# Patient Record
Sex: Male | Born: 1985 | Race: White | Hispanic: No | Marital: Married | Smoking: Current every day smoker
Health system: Southern US, Community
[De-identification: ages and names within clinical notes are randomized; demographics above are authoritative.]

## PROBLEM LIST (undated history)

## (undated) DIAGNOSIS — M1990 Unspecified osteoarthritis, unspecified site: Secondary | ICD-10-CM

## (undated) HISTORY — PX: NO PAST SURGERIES: SHX2092

## (undated) HISTORY — DX: Unspecified osteoarthritis, unspecified site: M19.90

---

## 2005-08-10 ENCOUNTER — Emergency Department: Payer: Self-pay | Admitting: Emergency Medicine

## 2008-10-25 ENCOUNTER — Emergency Department: Payer: Self-pay | Admitting: Emergency Medicine

## 2009-02-25 ENCOUNTER — Emergency Department: Payer: Self-pay | Admitting: Emergency Medicine

## 2009-06-18 ENCOUNTER — Ambulatory Visit: Payer: Self-pay | Admitting: Family Medicine

## 2010-03-14 ENCOUNTER — Emergency Department: Payer: Self-pay | Admitting: Unknown Physician Specialty
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
## 2019-01-14 ENCOUNTER — Telehealth: Payer: Self-pay

## 2019-01-14 NOTE — Telephone Encounter (Signed)
Patient called office wanting to put in request for referral. Reviewing over patients chart it has been over 59yrs since patient was last seen, please advise if okay for patient to re-establish in office? KW

## 2019-01-15 NOTE — Telephone Encounter (Signed)
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 Patient would like to re- establish care with Dr Sherrie Mustache. LOV: 03/15/2012 with Nadine Counts. Please advise.

## 2019-01-15 NOTE — Telephone Encounter (Signed)
My panel is full. Can see a PA or Dr. Leonard Schwartz

## 2019-01-16 NOTE — Telephone Encounter (Signed)
LMTCB 01/16/2019  Thanks,   -Laura  

## 2019-01-18 ENCOUNTER — Ambulatory Visit (INDEPENDENT_AMBULATORY_CARE_PROVIDER_SITE_OTHER): Payer: BLUE CROSS/BLUE SHIELD | Admitting: Physician Assistant

## 2019-01-18 ENCOUNTER — Other Ambulatory Visit: Payer: Self-pay

## 2019-01-18 ENCOUNTER — Encounter: Payer: Self-pay | Admitting: Physician Assistant

## 2019-01-18 VITALS — BP 125/82 | HR 86 | Temp 98.9°F | Resp 16 | Ht 70.0 in | Wt 261.0 lb

## 2019-01-18 DIAGNOSIS — S99912A Unspecified injury of left ankle, initial encounter: Secondary | ICD-10-CM

## 2019-01-18 DIAGNOSIS — M25532 Pain in left wrist: Secondary | ICD-10-CM

## 2019-01-18 DIAGNOSIS — Z765 Malingerer [conscious simulation]: Secondary | ICD-10-CM

## 2019-01-18 DIAGNOSIS — M25531 Pain in right wrist: Secondary | ICD-10-CM | POA: Diagnosis not present

## 2019-01-18 NOTE — Patient Instructions (Signed)
Arthritis  Arthritis means joint pain. It can also mean joint disease. A joint is a place where bones come together. People who have arthritis may have:  · Red joints.  · Swollen joints.  · Stiff joints.  · Warm joints.  · A fever.  · A feeling of being sick.  Follow these instructions at home:  Pay attention to any changes in your symptoms. Take these actions to help with your pain and swelling.  Medicines  · Take over-the-counter and prescription medicines only as told by your doctor.  · Do not take aspirin for pain if your doctor says that you may have gout.  Activity  · Rest your joint if your doctor tells you to.  · Avoid activities that make the pain worse.  · Exercise your joint regularly as told by your doctor. Try doing exercises like:  ? Swimming.  ? Water aerobics.  ? Biking.  ? Walking.  Joint Care    · If your joint is swollen, keep it raised (elevated) if told by your doctor.  · If your joint feels stiff in the morning, try taking a warm shower.  · If you have diabetes, do not apply heat without asking your doctor.  · If told, apply heat to the joint:  ? Put a towel between the joint and the hot pack or heating pad.  ? Leave the heat on the area for 20-30 minutes.  · If told, apply ice to the joint:  ? Put ice in a plastic bag.  ? Place a towel between your skin and the bag.  ? Leave the ice on for 20 minutes, 2-3 times per day.  · Keep all follow-up visits as told by your doctor.  Contact a doctor if:  · The pain gets worse.  · You have a fever.  Get help right away if:  · You have very bad pain in your joint.  · You have swelling in your joint.  · Your joint is red.  · Many joints become painful and swollen.  · You have very bad back pain.  · Your leg is very weak.  · You cannot control your pee (urine) or poop (stool).  This information is not intended to replace advice given to you by your health care provider. Make sure you discuss any questions you have with your health care provider.  Document  Released: 11/16/2009 Document Revised: 01/28/2016 Document Reviewed: 11/17/2014  Elsevier Interactive Patient Education © 2019 [employer]
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
## 2019-01-30 ENCOUNTER — Encounter: Payer: Self-pay | Admitting: Student in an Organized Health Care Education/Training Program

## 2019-01-31 ENCOUNTER — Other Ambulatory Visit: Payer: Self-pay

## 2019-01-31 ENCOUNTER — Ambulatory Visit
Payer: BLUE CROSS/BLUE SHIELD | Attending: Student in an Organized Health Care Education/Training Program | Admitting: Student in an Organized Health Care Education/Training Program

## 2019-01-31 ENCOUNTER — Encounter: Payer: Self-pay | Admitting: Student in an Organized Health Care Education/Training Program

## 2019-01-31 DIAGNOSIS — M25561 Pain in right knee: Secondary | ICD-10-CM | POA: Diagnosis not present

## 2019-01-31 DIAGNOSIS — G894 Chronic pain syndrome: Secondary | ICD-10-CM

## 2019-01-31 DIAGNOSIS — M2550 Pain in unspecified joint: Secondary | ICD-10-CM | POA: Diagnosis not present

## 2019-01-31 DIAGNOSIS — M25572 Pain in left ankle and joints of left foot: Secondary | ICD-10-CM

## 2019-01-31 DIAGNOSIS — M79641 Pain in right hand: Secondary | ICD-10-CM | POA: Diagnosis not present

## 2019-01-31 DIAGNOSIS — M899 Disorder of bone, unspecified: Secondary | ICD-10-CM

## 2019-01-31 DIAGNOSIS — G8929 Other chronic pain: Secondary | ICD-10-CM | POA: Insufficient documentation

## 2019-01-31 DIAGNOSIS — M79642 Pain in left hand: Secondary | ICD-10-CM

## 2019-01-31 DIAGNOSIS — M25571 Pain in right ankle and joints of right foot: Secondary | ICD-10-CM

## 2019-01-31 DIAGNOSIS — M25562 Pain in left knee: Secondary | ICD-10-CM

## 2019-03-21 DIAGNOSIS — L4050 Arthropathic psoriasis, unspecified: Secondary | ICD-10-CM | POA: Insufficient documentation

## 2019-03-27 ENCOUNTER — Encounter: Payer: Self-pay | Admitting: Student in an Organized Health Care Education/Training Program

## 2019-03-28 ENCOUNTER — Other Ambulatory Visit: Payer: Self-pay

## 2019-03-28 ENCOUNTER — Encounter: Payer: Self-pay | Admitting: Student in an Organized Health Care Education/Training Program

## 2019-03-28 ENCOUNTER — Ambulatory Visit
Payer: BLUE CROSS/BLUE SHIELD | Attending: Student in an Organized Health Care Education/Training Program | Admitting: Student in an Organized Health Care Education/Training Program

## 2019-03-28 DIAGNOSIS — M25572 Pain in left ankle and joints of left foot: Secondary | ICD-10-CM

## 2019-03-28 DIAGNOSIS — M2550 Pain in unspecified joint: Secondary | ICD-10-CM | POA: Diagnosis not present

## 2019-03-28 DIAGNOSIS — L4050 Arthropathic psoriasis, unspecified: Secondary | ICD-10-CM

## 2019-03-28 DIAGNOSIS — G894 Chronic pain syndrome: Secondary | ICD-10-CM | POA: Diagnosis not present

## 2019-03-28 DIAGNOSIS — M25571 Pain in right ankle and joints of right foot: Secondary | ICD-10-CM

## 2019-03-28 DIAGNOSIS — G8929 Other chronic pain: Secondary | ICD-10-CM

## 2019-03-28 DIAGNOSIS — M79641 Pain in right hand: Secondary | ICD-10-CM | POA: Diagnosis not present

## 2019-03-28 DIAGNOSIS — M79642 Pain in left hand: Secondary | ICD-10-CM

## 2019-03-28 DIAGNOSIS — M25562 Pain in left knee: Secondary | ICD-10-CM

## 2019-03-28 DIAGNOSIS — M25561 Pain in right knee: Secondary | ICD-10-CM

## 2019-03-28 MED ORDER — CELECOXIB 200 MG PO CAPS: 200.0000 mg | ORAL_CAPSULE | Freq: Every day | ORAL | 2 refills | Status: AC

## 2019-03-28 NOTE — Progress Notes (Signed)
Pain Management Virtual Encounter Note - Virtual Visit via Telephone Telehealth (real-time audio visits between healthcare provider and patient).   Patient's Phone No. & Preferred Pharmacy:  [phone] (home); There is no such number on file (mobile).; (Preferred) [phone] [email or web address]  [id number] [place] Rough- [place], [place] - [address] [facility] [address] [place] [address] Phone: [phone] Fax: [phone]    Pre-screening note:  Our staff contacted Bryan Key and offered him an "in person", "face-to-face" appointment versus a telephone encounter. He indicated preferring the telephone encounter, at this time.   Reason for Virtual Visit: COVID-19*  Social distancing based on CDC and AMA recommendations.   I contacted Bryan SatoJoshua R Neaves on 03/28/2019 via telephone.      I clearly identified myself as Edward JollyBilal Tamirra Sienkiewicz, MD. I verified that I was speaking with the correct person using two identifiers (Name: Bryan Key, and date of birth: 1986/03/31).  Advanced Informed Consent I sought verbal advanced consent from Bryan Key for virtual visit interactions. I informed Bryan Key of possible security and privacy concerns, risks, and limitations associated with providing "not-in-person" medical evaluation and management services. I also informed Bryan Key of the availability of "in-person" appointments. Finally, I informed him that there would be a charge for the virtual visit and that he could be  personally, fully or partially, financially responsible for it. Bryan Key expressed understanding and agreed to proceed.   Historic Elements   Bryan Key is a 33 y.o. year old, male patient evaluated today after his last encounter by our practice on 01/31/2019. Bryan Key  has a past medical history of Arthritis. He also  has a past surgical history that includes No past surgeries. Bryan Key has a current medication list which includes the  following prescription(s): celecoxib. He  reports that he has been smoking e-cigarettes. He has never used smokeless tobacco. No history on file for alcohol and drug. Bryan Key has no allergies on file.   HPI  Today, he is being contacted for second patient visit.  Patient was evaluated by rheumatology and was diagnosed with psoriatic arthritis.  We had extensive discussion today about evidence-based therapies for psoriatic arthritis.  For mild to moderate symptoms, I recommend judicious use of NSAIDs.  Patient has been utilizing high-dose ibuprofen and I recommended that he discontinue this medication and refrain from all other NSAIDs while he is taking Celebrex 200 mg daily.  For more advanced therapies to help manage his pain, I recommend the patient deferred to his rheumatologist.  They can discuss therapy such as methotrexate, leflunomide, apremilast, TNF inhibitors ETC. Opioid analgesics will not be a part of treatment plan here at the pain clinic.  Result Narrative  EXAM: Right Hand Radiographs - 3 views (PA, Lateral, Oblique) performed  6/19/202020  CLINICAL INFORMATION: Chronic bilateral wrist and hand pain  COMPARISON: None  FINDINGS:  Normal wrist and finger alignment. No scapholunate dissociation. No  fractures or dislocations. No soft tissue swelling or joint effusion.  Well maintained joint spaces without evidence of degenerative joint  disease. No loose bodies. No abnormal bone lesions.  EXAM: Left Hand Radiographs - 3 views (PA, Lateral, Oblique) performed  6/19/202020  CLINICAL INFORMATION: Chronic bilateral wrist and hand pain  COMPARISON: None  FINDINGS:  Normal wrist and finger alignment. No scapholunate dissociation. No  fractures or dislocations. No soft tissue swelling or joint effusion.  Well maintained joint spaces without evidence  of degenerative joint  disease. No loose bodies. No abnormal bone lesions. Right Knee Radiographs - 3 views  (Bilateral AP, Lateral, Bilateral  Sunrise) performed 02/22/2019  CLINICAL INFORMATION: Chronic bilateral (R>L) knee pain  COMPARISON: None  FINDINGS:  Normal tibiofemoral and patellofemoral alignment. No fractures or  dislocations. No soft tissue swelling or joint effusion. Well maintained  joint spaces without evidence of degenerative joint disease. No loose  bodies. No abnormal bone lesions.  Assessment  The primary encounter diagnosis was Psoriatic arthritis ([place]). Diagnoses of Polyarthralgia, Bilateral hand pain, Chronic pain syndrome, Chronic pain of both knees, and Chronic pain of both ankles were also pertinent to this visit.  Plan of Care  I have discontinued Bryan Key's ibuprofen. I am also having him start on celecoxib.  General Recommendations: The pain condition that the patient suffers from is best treated with a multidisciplinary approach that involves an increase in physical activity to prevent de-conditioning and worsening of the pain cycle, as well as psychological counseling (formal and/or informal) to address the co-morbid psychological affects of pain. Treatment will often involve judicious use of pain medications and interventional procedures to decrease the pain, allowing the patient to participate in the physical activity that will ultimately produce long-lasting pain reductions. The goal of the multidisciplinary approach is to return the patient to a higher level of overall function and to restore their ability to perform activities of daily living.  Patient was evaluated by rheumatology and was diagnosed with psoriatic arthritis.  We had extensive discussion today about evidence-based therapies for psoriatic arthritis.  Discussed the importance of physical therapy as well as weight loss.  For mild to moderate symptoms, I recommend judicious use of NSAIDs.  Patient has been utilizing high-dose ibuprofen and I recommended that he discontinue this medication and  refrain from all other NSAIDs while he is taking Celebrex 200 mg daily.  Monitor renal function.  Creatinine 0.9 02/22/2019.    For more advanced therapies to help manage his pain, I recommend the patient defer to his rheumatologist.  They can discuss therapy such as methotrexate, leflunomide, apremilast, TNF inhibitors ETC. Opioid analgesics will not be a part of treatment plan here at the pain clinic.  Pharmacotherapy (Medications Ordered): Meds ordered this encounter  Medications  . celecoxib (CELEBREX) 200 MG capsule    Sig: Take 1 capsule (200 mg total) by mouth daily.    Dispense:  30 capsule    Refill:  2   Orders:  No orders of the defined types were placed in this encounter.  Follow-up plan:   Return in about 6 months (around 09/28/2019) for Medication Management.        Recent Visits Date Type Provider Dept  01/31/19 Office Visit Gillis Santa, MD [facility]  Showing recent visits within past 90 days and meeting all other requirements   Today's Visits Date Type Provider Dept  03/28/19 Office Visit Gillis Santa, MD [facility]  Showing today's visits and meeting all other requirements   Future Appointments No visits were found meeting these conditions.  Showing future appointments within next 90 days and meeting all other requirements   I discussed the assessment and treatment plan with the patient. The patient was provided an opportunity to ask questions and all were answered. The patient agreed with the plan and demonstrated an understanding of the instructions.  Patient advised to call back or seek an in-person evaluation if the symptoms or condition worsens.  Total duration of non-face-to-face encounter:  25minutes.  Note by: Edward JollyBilal Jacaria Colburn, MD Date: 03/28/2019; Time: 11:33 AM  Note: This dictation was prepared with Dragon dictation. Any transcriptional errors that may result from this process are unintentional.  Disclaimer:  * Given the  special circumstances of the COVID-19 pandemic, the federal government has announced that the Office for Civil Rights (OCR) will exercise its enforcement discretion and will not impose penalties on physicians using telehealth in the event of noncompliance with regulatory requirements under the [employer] Insurance Portability and Accountability Act (HIPAA) in connection with the good faith provision of telehealth during the COVID-19 national public health emergency. (AMA)

## 2019-09-16 ENCOUNTER — Encounter: Payer: Self-pay | Admitting: Student in an Organized Health Care Education/Training Program

## 2019-09-17 ENCOUNTER — Ambulatory Visit (HOSPITAL_BASED_OUTPATIENT_CLINIC_OR_DEPARTMENT_OTHER): Payer: BLUE CROSS/BLUE SHIELD | Admitting: Student in an Organized Health Care Education/Training Program

## 2019-09-17 DIAGNOSIS — L4050 Arthropathic psoriasis, unspecified: Secondary | ICD-10-CM

## 2019-12-10 ENCOUNTER — Emergency Department
Admission: EM | Admit: 2019-12-10 | Discharge: 2019-12-10 | Disposition: A | Discharge status: Home / Self Care | Payer: Self-pay | Attending: Emergency Medicine | Admitting: Emergency Medicine

## 2019-12-10 ENCOUNTER — Other Ambulatory Visit: Payer: Self-pay

## 2019-12-10 DIAGNOSIS — Z5321 Procedure and treatment not carried out due to patient leaving prior to being seen by health care provider: Secondary | ICD-10-CM | POA: Insufficient documentation

## 2019-12-10 DIAGNOSIS — R109 Unspecified abdominal pain: Secondary | ICD-10-CM | POA: Insufficient documentation

## 2019-12-10 DIAGNOSIS — R1110 Vomiting, unspecified: Secondary | ICD-10-CM | POA: Insufficient documentation

## 2019-12-10 LAB — COMPREHENSIVE METABOLIC PANEL
ALT: 35 U/L (ref 0–44)
AST: 30 U/L (ref 15–41)
Albumin: 4.5 g/dL (ref 3.5–5.0)
Alkaline Phosphatase: 51 U/L (ref 38–126)
Anion gap: 10 (ref 5–15)
BUN: 19 mg/dL (ref 6–20)
CO2: 24 mmol/L (ref 22–32)
Calcium: 9.3 mg/dL (ref 8.9–10.3)
Chloride: 103 mmol/L (ref 98–111)
Creatinine, Ser: 0.72 mg/dL (ref 0.61–1.24)
GFR calc Af Amer: >60 mL/min (ref >60)
GFR calc non Af Amer: >60 mL/min (ref >60)
Glucose, Bld: 126 mg/dL — ABNORMAL HIGH (ref 70–99)
Potassium: 4.1 mmol/L (ref 3.5–5.1)
Sodium: 137 mmol/L (ref 135–145)
Total Bilirubin: 0.8 mg/dL (ref 0.3–1.2)
Total Protein: 7.9 g/dL (ref 6.5–8.1)

## 2019-12-10 LAB — CBC
HCT: 45.3 % (ref 39.0–52.0)
Hemoglobin: 15.2 g/dL (ref 13.0–17.0)
MCH: 30 pg (ref 26.0–34.0)
MCHC: 33.6 g/dL (ref 30.0–36.0)
MCV: 89.5 fL (ref 80.0–100.0)
Platelets: 194 10*3/uL (ref 150–400)
RBC: 5.06 MIL/uL (ref 4.22–5.81)
RDW: 11.6 % (ref 11.5–15.5)
WBC: 11.2 10*3/uL — ABNORMAL HIGH (ref 4.0–10.5)
nRBC: 0 % (ref 0.0–0.2)

## 2019-12-10 LAB — LIPASE, BLOOD: Lipase: 19 U/L (ref 11–51)

## 2019-12-10 MED ORDER — ONDANSETRON 4 MG PO TBDP
4.0000 mg | ORAL_TABLET | Freq: Once | ORAL | Status: AC
  Administered 2019-12-10: 4 mg via ORAL

## 2019-12-10 MED ORDER — ONDANSETRON 4 MG PO TBDP
ORAL_TABLET | ORAL | Status: AC
  Filled 2019-12-10: qty 1

## 2019-12-10 MED ORDER — ONDANSETRON 4 MG PO TBDP
4.0000 mg | ORAL_TABLET | Freq: Once | ORAL | Status: AC | PRN
  Administered 2019-12-10: 4 mg via ORAL

## 2019-12-10 NOTE — ED Notes (Signed)
Pt asking for another zofran. Pt reports he has had no further vomiting since but states it is returning.

## 2019-12-10 NOTE — ED Triage Notes (Signed)
Pt to the er for vomiting and abd pain that started tonight. Pt states both of his kids have had a 48 hour stomach bug. Pt states he is vomiting every 45 minutes and it got so bad he felt as if it was "suffocating him".

## 2020-07-09 NOTE — Progress Notes (Signed)
Cancelled visit

## 2022-01-20 ENCOUNTER — Other Ambulatory Visit: Payer: Self-pay

## 2022-01-20 ENCOUNTER — Emergency Department
Admission: EM | Admit: 2022-01-20 | Discharge: 2022-01-20 | Disposition: A | Discharge status: Home / Self Care | Payer: 59 | Attending: Emergency Medicine | Admitting: Emergency Medicine

## 2022-01-20 DIAGNOSIS — Z20822 Contact with and (suspected) exposure to covid-19: Secondary | ICD-10-CM | POA: Insufficient documentation

## 2022-01-20 DIAGNOSIS — J020 Streptococcal pharyngitis: Secondary | ICD-10-CM | POA: Insufficient documentation

## 2022-01-20 DIAGNOSIS — J029 Acute pharyngitis, unspecified: Secondary | ICD-10-CM | POA: Diagnosis not present

## 2022-01-20 LAB — RESP PANEL BY RT-PCR (FLU A&B, COVID) ARPGX2
Influenza A by PCR: NEGATIVE
Influenza B by PCR: NEGATIVE
SARS Coronavirus 2 by RT PCR: NEGATIVE

## 2022-01-20 LAB — GROUP A STREP BY PCR: Group A Strep by PCR: DETECTED — AB

## 2022-01-20 MED ORDER — AMOXICILLIN 500 MG PO TABS: 500.0000 mg | ORAL_TABLET | Freq: Two times a day (BID) | ORAL | 0 refills | Status: AC

## 2022-01-20 MED ORDER — DEXAMETHASONE 4 MG PO TABS
10.0000 mg | ORAL_TABLET | Freq: Once | ORAL | Status: AC
  Administered 2022-01-20: 10 mg via ORAL
  Filled 2022-01-20 (×2): qty 2.5

## 2022-01-20 NOTE — ED Triage Notes (Signed)
Pt here with a sore throat that started Sat. Pt had a tele doc appt and was told to come in to the ED for swollen tonsils. Pt states he cannot eat solid food due to the pain and states his wife said he was gasping for air in his sleep last night.

## 2022-01-20 NOTE — Discharge Instructions (Addendum)
-  Take all of the antibiotics as prescribed.  -Return to the emergency department anytime if you begin to experience any new or worsening symptoms.

## 2022-01-20 NOTE — ED Provider Notes (Signed)
[facility] Provider Note    Event Date/Time   First MD Initiated Contact with Patient 01/20/22 1253     (approximate)   History   Chief Complaint Sore Throat   HPI Bryan Key is a 36 y.o. male, history of psoriatic arthritis, presents to the emergency department for evaluation of sore throat.  Patient states that his sore throat began approximately 5 days ago.  It has failed to improve over the past few days.  Patient states that he cannot eat solid foods due to the pain.  His wife stated that he was gasping for air and last night while he was sleeping.  Denies fever/chills, chest pain, shortness of breath, abdominal pain, flank pain, nausea/vomiting, diarrhea, urinary symptoms, headache, vision changes, rash/lesions, or dizziness/lightheadedness.  History Limitations: No limitations.        Physical Exam  Triage Vital Signs: ED Triage Vitals  Enc Vitals Group     BP 01/20/22 1241 (!) 144/89     Pulse Rate 01/20/22 1241 89     Resp 01/20/22 1241 18     Temp 01/20/22 1241 97.8 F (36.6 C)     Temp Source 01/20/22 1241 Oral     SpO2 01/20/22 1241 99 %     Weight 01/20/22 1242 260 lb (117.9 kg)     Height 01/20/22 1242 5\' 10"  (1.778 m)     Head Circumference --      Peak Flow --      Pain Score 01/20/22 1241 4     Pain Loc --      Pain Edu? --      Excl. in [place]? --     Most recent vital signs: Vitals:   01/20/22 1241  BP: (!) 144/89  Pulse: 89  Resp: 18  Temp: 97.8 F (36.6 C)  SpO2: 99%    General: Awake, NAD.  Skin: Warm, dry. No rashes or lesions.  Eyes: PERRL. Conjunctivae normal.  CV: Good peripheral perfusion.  Resp: Normal effort.  Abd: Soft, non-tender. No distention.  Neuro: At baseline. No gross neurological deficits.   Focused Exam: Markedly swollen tonsils bilaterally, no exudates.  No submandibular masses.  No uvula deviation  Physical Exam    ED Results / Procedures / Treatments  Labs (all labs ordered are  listed, but only abnormal results are displayed) Labs Reviewed  GROUP A STREP BY PCR - Abnormal; Notable for the following components:      Result Value   Group A Strep by PCR DETECTED (*)    All other components within normal limits  RESP PANEL BY RT-PCR (FLU A&B, COVID) [id number]     EKG N/A.   RADIOLOGY  ED Provider Interpretation: N/A.  No results found.  PROCEDURES:  Critical Care performed: N/A.  Procedures    MEDICATIONS ORDERED IN ED: Medications  dexamethasone (DECADRON) tablet 10 mg (10 mg Oral Given 01/20/22 1346)     IMPRESSION / MDM / ASSESSMENT AND PLAN / ED COURSE  I reviewed the triage vital signs and the nursing notes.                              Differential diagnosis includes, but is not limited to, tonsillitis, strep pharyngitis, COVID-19, influenza, peritonsillar abscess.  ED Course Patient appears well, vitals within normal limits for the patient. NAD.  Group A strep PCR positive.  Respiratory panel pending  Assessment/Plan Presentation consistent with strep  pharyngitis.  No evidence of peritonsillar abscess or other secondary complications.  Patient otherwise appears well.  Will provide with a prescription for amoxicillin.  Advised him to utilize salt water rinses and over-the-counter throat lozenges as needed for the sore throat.  We will plan to discharge.  Provided the patient with anticipatory guidance, return precautions, and educational material. Encouraged the patient to return to the emergency department at any time if they begin to experience any new or worsening symptoms. Patient expressed understanding and agreed with the plan.       FINAL CLINICAL IMPRESSION(S) / ED DIAGNOSES   Final diagnoses:  Strep throat     Rx / DC Orders   ED Discharge Orders          Ordered    amoxicillin (AMOXIL) 500 MG tablet  2 times daily        01/20/22 1401             Note:  This document was prepared using Dragon voice  recognition software and may include unintentional dictation errors.   Teodoro Spray, [place] 01/20/22 1402    Vanessa Hansford, MD 01/20/22 [phone]
